# Patient Record
Sex: Female | Born: 1980 | Race: White | Hispanic: No | Marital: Single | Smoking: Never smoker
Health system: Southern US, Community
[De-identification: ages and names within clinical notes are randomized; demographics above are authoritative.]

## PROBLEM LIST (undated history)

## (undated) DIAGNOSIS — T7840XA Allergy, unspecified, initial encounter: Secondary | ICD-10-CM

## (undated) HISTORY — DX: Allergy, unspecified, initial encounter: T78.40XA

---

## 2004-08-27 ENCOUNTER — Emergency Department (HOSPITAL_COMMUNITY): Admission: EM | Admit: 2004-08-27 | Discharge: 2004-08-27 | Payer: Self-pay | Admitting: Emergency Medicine

## 2007-04-05 ENCOUNTER — Other Ambulatory Visit: Admission: RE | Admit: 2007-04-05 | Discharge: 2007-04-05 | Payer: Self-pay | Admitting: Family Medicine

## 2010-07-29 ENCOUNTER — Emergency Department (HOSPITAL_COMMUNITY): Admission: EM | Admit: 2010-07-29 | Discharge: 2010-07-29 | Payer: Self-pay | Admitting: Family Medicine

## 2011-12-10 ENCOUNTER — Ambulatory Visit (INDEPENDENT_AMBULATORY_CARE_PROVIDER_SITE_OTHER): Payer: 59 | Admitting: Internal Medicine

## 2011-12-10 ENCOUNTER — Ambulatory Visit: Payer: 59

## 2011-12-10 DIAGNOSIS — R05 Cough: Secondary | ICD-10-CM

## 2011-12-10 DIAGNOSIS — R042 Hemoptysis: Secondary | ICD-10-CM

## 2011-12-10 DIAGNOSIS — J209 Acute bronchitis, unspecified: Secondary | ICD-10-CM

## 2011-12-10 DIAGNOSIS — J029 Acute pharyngitis, unspecified: Secondary | ICD-10-CM

## 2011-12-10 DIAGNOSIS — R059 Cough, unspecified: Secondary | ICD-10-CM

## 2011-12-10 LAB — POCT CBC
Granulocyte percent: 62.9 %G (ref 37–80)
HCT, POC: 46.6 % (ref 37.7–47.9)
MID (cbc): 1 — AB (ref 0–0.9)
POC Granulocyte: 7.4 — AB (ref 2–6.9)
POC LYMPH PERCENT: 28.5 %L (ref 10–50)
Platelet Count, POC: 370 10*3/uL (ref 142–424)
RDW, POC: 13.6 %

## 2011-12-10 MED ORDER — AZITHROMYCIN 500 MG PO TABS: 500.0000 mg | ORAL_TABLET | Freq: Every day | ORAL | Status: AC

## 2011-12-10 MED ORDER — PREDNISONE 50 MG PO TABS: ORAL_TABLET | ORAL | Status: AC

## 2011-12-10 NOTE — Progress Notes (Signed)
  Subjective:    Patient ID: Joyce Adkins, female    DOB: [birth year], 31 y.o.   MRN: [id number]  HPIcough sorethroat fatigue. Coughing small amts of blood non smoker. Low grade fever. Has chills. No nvd. No ha no stiff neck.     Review of Systems  Constitutional: Positive for chills, activity change, appetite change and fatigue.  HENT: Positive for congestion and rhinorrhea.   Eyes: Negative.   Respiratory: Positive for cough.   Cardiovascular: Negative.   Gastrointestinal: Negative.   Genitourinary: Negative.   Musculoskeletal: Negative.   Skin: Negative.   Neurological: Negative.   Hematological: Negative.   Psychiatric/Behavioral: Negative.   All other systems reviewed and are negative.       Objective:   Physical Exam  Constitutional: She is oriented to person, place, and time. She appears well-developed and well-nourished.  HENT:  Head: Normocephalic.       Throat injected. Uvula edematous. No exudate. No abcess  Eyes: Conjunctivae and EOM are normal. Pupils are equal, round, and reactive to light.  Neck: Normal range of motion. Neck supple.  Cardiovascular: Normal rate, regular rhythm and normal heart sounds.   Pulmonary/Chest: Effort normal. She has wheezes.  Abdominal: Soft. Bowel sounds are normal.  Musculoskeletal: Normal range of motion.  Neurological: She is alert and oriented to person, place, and time.  Skin: Skin is warm and dry.  Psychiatric: She has a normal mood and affect. Her behavior is normal. Judgment and thought content normal.  [facility] reading (PRIMARY) by  Dr. Mindi Junker. Increased bronchial markings no pneumonia.  Results for orders placed in visit on 12/10/11  POCT CBC      Component Value Range   WBC 11.7 (*) 4.6 - 10.2 (K/uL)   Lymph, poc 3.3  0.6 - 3.4    POC LYMPH PERCENT 28.5  10 - 50 (%L)   MID (cbc) 1.0 (*) 0 - 0.9    POC MID % 8.6  0 - 12 (%M)   POC Granulocyte 7.4 (*) 2 - 6.9    Granulocyte percent 62.9  37 - 80 (%G)   RBC 5.16   4.04 - 5.48 (M/uL)   Hemoglobin 15.1  12.2 - 16.2 (g/dL)   HCT, POC 40.9  81.1 - 47.9 (%)   MCV 90.3  80 - 97 (fL)   MCH, POC 29.3  27 - 31.2 (pg)   MCHC 32.4  31.8 - 35.4 (g/dL)   RDW, POC 91.4     Platelet Count, POC 370  142 - 424 (K/uL)   MPV 9.3  0 - 99.8 (fL)   visi       Assessment & Plan:  Hemoptysis and edema of pharynx will do xray lab eval suspect will give antibitoics and steroids, antitussives.

## 2013-08-16 ENCOUNTER — Ambulatory Visit (INDEPENDENT_AMBULATORY_CARE_PROVIDER_SITE_OTHER): Payer: 59 | Admitting: Physician Assistant

## 2013-08-16 VITALS — BP 124/76 | HR 100 | Temp 97.6°F | Resp 16 | Ht 62.0 in | Wt 183.0 lb

## 2013-08-16 DIAGNOSIS — J029 Acute pharyngitis, unspecified: Secondary | ICD-10-CM

## 2013-08-16 DIAGNOSIS — R05 Cough: Secondary | ICD-10-CM

## 2013-08-16 DIAGNOSIS — J3489 Other specified disorders of nose and nasal sinuses: Secondary | ICD-10-CM

## 2013-08-16 DIAGNOSIS — R059 Cough, unspecified: Secondary | ICD-10-CM

## 2013-08-16 DIAGNOSIS — R0981 Nasal congestion: Secondary | ICD-10-CM

## 2013-08-16 LAB — POCT CBC
Granulocyte percent: 74.9 %G (ref 37–80)
HCT, POC: 45.5 % (ref 37.7–47.9)
Hemoglobin: 14.3 g/dL (ref 12.2–16.2)
Lymph, poc: 2.7 (ref 0.6–3.4)
MCH, POC: 28.5 pg (ref 27–31.2)
MCHC: 31.4 g/dL — AB (ref 31.8–35.4)
MCV: 90.9 fL (ref 80–97)
MID (cbc): 0.7 (ref 0–0.9)
MPV: 8.4 fL (ref 0–99.8)
POC Granulocyte: 10.2 — AB (ref 2–6.9)
POC LYMPH PERCENT: 19.9 %L (ref 10–50)
POC MID %: 5.2 %M (ref 0–12)
Platelet Count, POC: 439 10*3/uL — AB (ref 142–424)
RBC: 5.01 M/uL (ref 4.04–5.48)
RDW, POC: 14.8 %
WBC: 13.6 10*3/uL — AB (ref 4.6–10.2)

## 2013-08-16 LAB — POCT RAPID STREP A (OFFICE): Rapid Strep A Screen: NEGATIVE

## 2013-08-16 MED ORDER — AMOXICILLIN 875 MG PO TABS: 875.0000 mg | ORAL_TABLET | Freq: Two times a day (BID) | ORAL | Status: DC

## 2013-08-16 MED ORDER — HYDROCODONE-HOMATROPINE 5-1.5 MG/5ML PO SYRP: 5.0000 mL | ORAL_SOLUTION | Freq: Three times a day (TID) | ORAL | Status: DC | PRN

## 2013-08-16 MED ORDER — IPRATROPIUM BROMIDE 0.03 % NA SOLN: 2.0000 | Freq: Two times a day (BID) | NASAL | Status: DC

## 2013-08-16 NOTE — Progress Notes (Signed)
Subjective:    Patient ID: Joyce Adkins, female    DOB: 05-Jun-1981, 32 y.o.   MRN: [id number]  Sore Throat  Associated symptoms include congestion and coughing. Pertinent negatives include no ear pain, headaches, shortness of breath or trouble swallowing.  Cough Associated symptoms include a sore throat and wheezing (subjective). Pertinent negatives include no chest pain, chills, ear pain, fever, headaches, postnasal drip or shortness of breath.   32 year old female presents for evaluation of 4 day history of sore throat, nasal congestion and cough.  States symptoms have been progressively worsening and last night she noticed "white spots" on her tonsils. Denies headache, otalgia, sinus pain, hemoptysis, fever, chills, nausea, or vomiting.  She has no hx of frequent strep infections and no known strep contacts.  Works at [employer] sites.   Patient is doing well with no other concerns today.     Review of Systems  Constitutional: Negative for fever and chills.  HENT: Positive for congestion and sore throat. Negative for ear pain, postnasal drip, sinus pressure and trouble swallowing.   Respiratory: Positive for cough and wheezing (subjective). Negative for shortness of breath.   Cardiovascular: Negative for chest pain.  Neurological: Negative for dizziness and headaches.       Objective:   Physical Exam  Constitutional: She is oriented to person, place, and time. She appears well-developed and well-nourished.  HENT:  Head: Normocephalic and atraumatic.  Right Ear: Hearing, tympanic membrane, external ear and ear canal normal.  Left Ear: Hearing, tympanic membrane, external ear and ear canal normal.  Mouth/Throat: Uvula is midline and mucous membranes are normal. Oropharyngeal exudate and posterior oropharyngeal erythema (2+ tonsillar swelling) present. No posterior oropharyngeal edema or tonsillar abscesses.  Eyes: Conjunctivae are normal.  Neck:  Normal range of motion. Neck supple.  Cardiovascular: Normal rate, regular rhythm and normal heart sounds.   Pulmonary/Chest: Effort normal and breath sounds normal.  Lymphadenopathy:    She has no cervical adenopathy.  Neurological: She is alert and oriented to person, place, and time.  Psychiatric: She has a normal mood and affect. Her behavior is normal. Judgment and thought content normal.     Results for orders placed in visit on 08/16/13  POCT RAPID STREP A (OFFICE)      Result Value Range   Rapid Strep A Screen Negative  Negative  POCT CBC      Result Value Range   WBC 13.6 (*) 4.6 - 10.2 K/uL   Lymph, poc 2.7  0.6 - 3.4   POC LYMPH PERCENT 19.9  10 - 50 %L   MID (cbc) 0.7  0 - 0.9   POC MID % 5.2  0 - 12 %M   POC Granulocyte 10.2 (*) 2 - 6.9   Granulocyte percent 74.9  37 - 80 %G   RBC 5.01  4.04 - 5.48 M/uL   Hemoglobin 14.3  12.2 - 16.2 g/dL   HCT, POC 08.6  57.8 - 47.9 %   MCV 90.9  80 - 97 fL   MCH, POC 28.5  27 - 31.2 pg   MCHC 31.4 (*) 31.8 - 35.4 g/dL   RDW, POC 46.9     Platelet Count, POC 439 (*) 142 - 424 K/uL   MPV 8.4  0 - 99.8 fL        Assessment & Plan:   Acute pharyngitis - Plan: POCT rapid strep A, POCT CBC, Culture, Group A Strep, amoxicillin (AMOXIL) 875 MG tablet  Nasal congestion - Plan: POCT CBC, Culture, Group A Strep, ipratropium (ATROVENT) 0.03 % nasal spray  Cough - Plan: POCT CBC, Culture, Group A Strep, HYDROcodone-homatropine (HYCODAN) 5-1.5 MG/5ML syrup  Start amoxicillin 875 mg bid x 10 days Throat culture sent Atrovent NS twice daily to help with congestion Hycodan qhs prn cough - caution sedation Increase fluids and rest Follow up if symptoms worsen or fail to improve.

## 2013-08-18 LAB — CULTURE, GROUP A STREP: Organism ID, Bacteria: NORMAL

## 2013-09-30 ENCOUNTER — Ambulatory Visit (INDEPENDENT_AMBULATORY_CARE_PROVIDER_SITE_OTHER): Payer: 59 | Admitting: Family Medicine

## 2013-09-30 VITALS — BP 130/90 | HR 79 | Temp 98.1°F | Resp 18 | Ht 62.0 in | Wt 180.0 lb

## 2013-09-30 DIAGNOSIS — Z23 Encounter for immunization: Secondary | ICD-10-CM

## 2013-09-30 DIAGNOSIS — Z0000 Encounter for general adult medical examination without abnormal findings: Secondary | ICD-10-CM

## 2013-09-30 DIAGNOSIS — E663 Overweight: Secondary | ICD-10-CM | POA: Insufficient documentation

## 2013-09-30 LAB — POCT CBC
Granulocyte percent: 59.3 %G (ref 37–80)
MID (cbc): 0.7 (ref 0–0.9)
MPV: 9.2 fL (ref 0–99.8)
POC Granulocyte: 5 (ref 2–6.9)
POC MID %: 8.2 %M (ref 0–12)
Platelet Count, POC: 406 10*3/uL (ref 142–424)
RBC: 5.22 M/uL (ref 4.04–5.48)

## 2013-09-30 LAB — COMPREHENSIVE METABOLIC PANEL
ALT: <8 U/L (ref 0–35)
AST: 22 U/L (ref 0–37)
Albumin: 4.5 g/dL (ref 3.5–5.2)
CO2: 25 mEq/L (ref 19–32)
Calcium: 9.6 mg/dL (ref 8.4–10.5)
Chloride: 104 mEq/L (ref 96–112)
Creat: 0.7 mg/dL (ref 0.50–1.10)
Potassium: 4.3 mEq/L (ref 3.5–5.3)
Sodium: 140 mEq/L (ref 135–145)
Total Protein: 7.4 g/dL (ref 6.0–8.3)

## 2013-09-30 LAB — LIPID PANEL
HDL: 50 mg/dL (ref >39)
LDL Cholesterol: 87 mg/dL (ref 0–99)
VLDL: 16 mg/dL (ref 0–40)

## 2013-09-30 LAB — TSH: TSH: 1.64 u[IU]/mL (ref 0.350–4.500)

## 2013-09-30 NOTE — Progress Notes (Signed)
[facility] [address] [place] [address] [phone]  Date:  09/30/2013   Name:  Joyce Adkins   DOB:  April 12, 1981   MRN:  [id number]  PCP:  No primary provider on file.    Chief Complaint: Employment Physical   History of Present Illness:  Joyce Adkins is a 32 y.o. very pleasant female patient who presents with the following:  She is here today for a CPE for her job; she has a form to complete.  She needs basic labs, does not desire a pap today.  She is fasting today. 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   LMP 12/18 Admits she is not sure of the date of her last tetanus shot but thinks it has been a long time- maybe before she started college. Would like to get a flu shot today as well She is single, does not smoke, social alcohol only, no drugs, not currently SA  There are no active problems to display for this patient.   Past Medical History  Diagnosis Date  . Allergy     History reviewed. No pertinent past surgical history.  History  Substance Use Topics  . Smoking status: Never Smoker   . Smokeless tobacco: Not on file  . Alcohol Use: Not on file    Family History  Problem Relation Age of Onset  . Heart disease Mother   . Diabetes Father   . Cancer Maternal Grandmother   . Heart disease Maternal Grandmother   . Stroke Paternal Grandfather   . Parkinson's disease Paternal Grandfather     No Known Allergies  Medication list has been reviewed and updated.  Current Outpatient Prescriptions on File Prior to Visit  Medication Sig Dispense Refill  . buPROPion (WELLBUTRIN XL) 300 MG 24 hr tablet Take 300 mg by mouth daily.      . clonazePAM (KLONOPIN) 0.5 MG tablet Take 0.5 mg by mouth 2 (two) times daily as needed.      Marland Kitchen ipratropium (ATROVENT) 0.03 % nasal spray Place 2 sprays into the nose 2 (two) times daily.  30 mL  0  . sertraline (ZOLOFT) 50 MG tablet Take 50  mg by mouth daily.      Marland Kitchen zolpidem (AMBIEN) 10 MG tablet Take 10 mg by mouth at bedtime as needed.       No current facility-administered medications on file prior to visit.    Review of Systems:  As per HPI- otherwise negative.   Physical Examination: Filed Vitals:   09/30/13 0829  BP: 130/90  Pulse: 79  Temp: 98.1 F (36.7 C)  Resp: 18   Filed Vitals:   09/30/13 0829  Height: 5\' 2"  (1.575 m)  Weight: 180 lb (81.647 kg)   Body mass index is 32.91 kg/(m^2). Ideal Body Weight: Weight in (lb) to have BMI = 25: 136.4  GEN: WDWN, NAD, Non-toxic, A & O x 3, overweight HEENT: Atraumatic, Normocephalic. Neck supple. No masses, No LAD.  Bilateral TM wnl, oropharynx normal.  PEERL,EOMI.   Ears and Nose: No external deformity. CV: RRR, No M/G/R. No JVD. No thrill. No extra heart sounds. PULM: CTA B, no wheezes, crackles, rhonchi. No retractions. No resp. distress. No accessory muscle use. ABD: S, NT, ND. No rebound. No HSM. EXTR: No c/c/e NEURO Normal gait.  PSYCH: Normally interactive. Conversant. Not depressed or anxious appearing.  Calm demeanor.    Assessment and Plan:  Physical exam - Plan: POCT CBC, Comprehensive metabolic panel, TSH, Lipid panel, Flu Vaccine QUAD 36+ mos IM, Tdap vaccine greater than or equal to 7yo IM  Flu shot and tdap today. Will plan further follow- up pending labs. See patient instructions for more details.      Signed Abbe Amsterdam, MD

## 2013-09-30 NOTE — Patient Instructions (Signed)
Good to see you today!  You received a Tdap vaccine today- in about 10 years you will be due for a plain "Td" vaccine.   I will be in touch with your labs when they come in; if you like, sign up for mychart.  This makes communication faster and easier.    Happy holidays!

## 2013-10-03 ENCOUNTER — Encounter: Payer: Self-pay | Admitting: Family Medicine

## 2013-10-13 ENCOUNTER — Ambulatory Visit (INDEPENDENT_AMBULATORY_CARE_PROVIDER_SITE_OTHER): Payer: 59 | Admitting: Family Medicine

## 2013-10-13 ENCOUNTER — Ambulatory Visit: Payer: 59

## 2013-10-13 VITALS — BP 142/106 | HR 86 | Temp 99.0°F | Resp 16 | Ht 62.0 in | Wt 187.0 lb

## 2013-10-13 DIAGNOSIS — R579 Shock, unspecified: Secondary | ICD-10-CM

## 2013-10-13 DIAGNOSIS — M25559 Pain in unspecified hip: Secondary | ICD-10-CM

## 2013-10-13 DIAGNOSIS — R079 Chest pain, unspecified: Secondary | ICD-10-CM

## 2013-10-13 DIAGNOSIS — T148XXA Other injury of unspecified body region, initial encounter: Secondary | ICD-10-CM

## 2013-10-13 DIAGNOSIS — M25551 Pain in right hip: Secondary | ICD-10-CM

## 2013-10-13 DIAGNOSIS — S20219A Contusion of unspecified front wall of thorax, initial encounter: Secondary | ICD-10-CM

## 2013-10-13 MED ORDER — TRAMADOL HCL 50 MG PO TABS: 50.0000 mg | ORAL_TABLET | Freq: Three times a day (TID) | ORAL | Status: DC | PRN

## 2013-10-13 MED ORDER — CYCLOBENZAPRINE HCL 5 MG PO TABS: 5.0000 mg | ORAL_TABLET | Freq: Every evening | ORAL | Status: DC | PRN
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
## 2013-11-03 ENCOUNTER — Encounter: Payer: Self-pay | Admitting: Physician Assistant

## 2013-11-03 ENCOUNTER — Telehealth: Payer: Self-pay

## 2013-11-03 ENCOUNTER — Ambulatory Visit (INDEPENDENT_AMBULATORY_CARE_PROVIDER_SITE_OTHER): Payer: 59 | Admitting: Physician Assistant

## 2013-11-03 VITALS — BP 118/68 | HR 73 | Temp 98.1°F | Resp 16 | Ht 62.25 in | Wt 190.0 lb

## 2013-11-03 DIAGNOSIS — W5503XA Scratched by cat, initial encounter: Principal | ICD-10-CM

## 2013-11-03 DIAGNOSIS — S60519A Abrasion of unspecified hand, initial encounter: Secondary | ICD-10-CM

## 2013-11-03 DIAGNOSIS — IMO0002 Reserved for concepts with insufficient information to code with codable children: Secondary | ICD-10-CM

## 2013-11-03 MED ORDER — AZITHROMYCIN 250 MG PO TABS: ORAL_TABLET | ORAL | Status: DC

## 2013-11-03 NOTE — Patient Instructions (Signed)
Take the antibiotics as directed.  Recheck in one week, sooner if anything is worsening   Cat Scratch Disease Cats often injure people by scratching or biting. This site of injury can become infected with a particular germ or bacteria present in the mouth of or on the cat. This germ is called Bartonella henselae. This infection is identified by the common name cat scratch disease (CSD).  SYMPTOMS  A red and sore pimple or bump, with or without pus, on the skin where the cat scratched or bit. The pimple or sore may be present for as long as three weeks after the scratch or bite occurred.  One or more enlarged lymph glands located toward the center of the body from where the injury occurred.  Less common symptoms include low-grade fever, tiredness, fatigue, headache and/or sore throat. DIAGNOSIS  The diagnosis is typically made by your caregiver who notes the history of a scratch or bite from a cat, and finds the skin sore and swollen lymph glands in the described area.  Culture of any drainage or pus from the injury site, or a needle aspiration or piece of tissue (biopsy) from a swollen lymph gland may also be done to confirm the diagnosis and assure that a different infection or disease is not causing your illness. Rare but serious complications may occur, they include:  Parinaud's syndrome - fever, swollen lymph glands and inflammation of the eye (conjunctivitis).  Infection of the brain (encephalitis).  Infection of the nerve of the eye (neuroretinitis).  Infection of the bone (osteomyelitis). TREATMENT  Usually treatment is not necessary or helpful, especially if you have a normal immune system. When infection is very severe, it may be treated with a medicine that kills the bacteria (antibiotic).  People with immune system problems (such as having AIDS or an organ transplant, or being on steroids or other immune modifying drugs) should be treated with antibiotics. HOME CARE  INSTRUCTIONS   Avoid injury while playing with cats.  Wash well after playing with cats.  Do not let your cat lick sores on your body.  Do not let your cat roam around outside of your house.  Keep the area of the cat scratch clean. Wash it with soap and water or apply an antiseptic solution such as povidone iodine.  You should get a tetanus shot if you have not had one in the past 5 or 10 years. If you receive one, your arm may get swollen and red and warm to the touch at the shot site. This is a common response to the medication in the shot. If you did not receive a tetanus shot here because you did not recall when your last one was given, make sure to check with your caregiver's office and determine if one is needed. Generally, for a "dirty" wound, you should receive a tetanus booster if you have not had one in the last five years. If you have a "clean" wound, you should receive a tetanus booster if you have not had one in the last ten years. SEEK IMMEDIATE MEDICAL CARE IF:   You have worsening signs of infection, such as more redness, increased pain, red streaking or pus coming from the wound, or warmth or swelling around the area of the scratch.  You develop worsening swollen lymph glands.  You develop abdominal pain, have problems with your vision or develop a skin rash.  You have a fever.  You become more tired or dizzy, or have a worsening headache.  You develop inflammation of your eye or have increasing vision problems.  You have pain in one of your bones.  You develop a stiff neck.  You pass out. MAKE SURE YOU:   Understand these instructions.  Will watch your condition.  Will get help right away if you are not doing well or get worse. Document Released: 09/22/2000 Document Revised: 12/18/2011 Document Reviewed: 11/04/2008 [facility] Patient Information 2014 [place], [place].

## 2013-11-03 NOTE — Progress Notes (Signed)
   Subjective:    Patient ID: Waldo LaineDonna S Cui, female    DOB: June 12, 1981, 33 y.o.   MRN: [id number]  HPI   Ms. Allayne GitelmanRasmussen is a very pleasant 33 yr old female here with concern for infection after sustaining a cat scratch 6 days ago.  Was scratched by her friend's cat on her left wrist.  The cat is an indoor cat and is utd on vaccinations.  About 2-3 days after sustaining the scratch, the pt reports development of red bumps along both scratches.  She denies pain or itching, but states the area is irritated.  She applied an antibacterial ointment after the bumps developed but saw no improvement so discontinued.  She denies any associated symptoms including fever, chills, adenopathy.  Last Tdap 09/30/13  Review of Systems  Constitutional: Negative for fever and chills.  Respiratory: Negative.   Cardiovascular: Negative.   Gastrointestinal: Negative.   Musculoskeletal: Negative for arthralgias and joint swelling.  Skin: Positive for color change and rash. Negative for wound.  Hematological: Negative for adenopathy.       Objective:   Physical Exam  Vitals reviewed. Constitutional: She is oriented to person, place, and time. She appears well-developed and well-nourished. No distress.  HENT:  Head: Normocephalic and atraumatic.  Eyes: Conjunctivae are normal. No scleral icterus.  Cardiovascular: Normal rate, regular rhythm and normal heart sounds.   Pulmonary/Chest: Effort normal and breath sounds normal. She has no wheezes. She has no rales.  Musculoskeletal:       Arms: Two linear scratches at left wrist; fine erythematous vesicles along both scratches; no evidence of cellulitis - no warmth, induration; no drainage; tenderness; no lymphangitis; no epitrochlear or axillary nodes; full ROM  Lymphadenopathy:       Left axillary: No pectoral and no lateral adenopathy present.      Left: No supraclavicular and no epitrochlear adenopathy present.  Neurological: She is alert and oriented to  person, place, and time.  Skin: Skin is warm and dry.  Psychiatric: She has a normal mood and affect. Her behavior is normal.       Assessment & Plan:  Cat scratch of hand - Plan: azithromycin (ZITHROMAX) 250 MG tablet  Ms. Allayne GitelmanRasmussen is a very pleasant 33 yr old female here with the development of fine erythematous vesicular lesions along 2 cat scratches that were sustained a week ago.  There is no evidence of a cellulitis.  She has no lymphangitis, adenopathy, or fever, but question early cat scratch disease.  Will treat for bartonella with azithromycin.  Follow up in 1 wk, sooner if worsening or new symptoms  E. Frances FurbishElizabeth Yexalen Deike MHS, PA-C [facility] 1/26/201510:00 PM

## 2014-07-24 ENCOUNTER — Other Ambulatory Visit: Payer: Self-pay

## 2014-09-09 ENCOUNTER — Encounter: Payer: Self-pay | Admitting: Family Medicine

## 2014-09-09 ENCOUNTER — Ambulatory Visit (INDEPENDENT_AMBULATORY_CARE_PROVIDER_SITE_OTHER): Payer: 59 | Admitting: Family Medicine

## 2014-09-09 VITALS — BP 174/108 | HR 110 | Temp 98.3°F | Resp 16 | Ht 62.0 in | Wt 189.2 lb

## 2014-09-09 DIAGNOSIS — N939 Abnormal uterine and vaginal bleeding, unspecified: Secondary | ICD-10-CM

## 2014-09-09 DIAGNOSIS — Z124 Encounter for screening for malignant neoplasm of cervix: Secondary | ICD-10-CM

## 2014-09-09 DIAGNOSIS — Z3009 Encounter for other general counseling and advice on contraception: Secondary | ICD-10-CM

## 2014-09-09 DIAGNOSIS — R030 Elevated blood-pressure reading, without diagnosis of hypertension: Secondary | ICD-10-CM

## 2014-09-09 DIAGNOSIS — IMO0001 Reserved for inherently not codable concepts without codable children: Secondary | ICD-10-CM

## 2014-09-09 DIAGNOSIS — Z23 Encounter for immunization: Secondary | ICD-10-CM

## 2014-09-09 DIAGNOSIS — Z0000 Encounter for general adult medical examination without abnormal findings: Secondary | ICD-10-CM

## 2014-09-09 LAB — COMPREHENSIVE METABOLIC PANEL
ALBUMIN: 4.5 g/dL (ref 3.5–5.2)
ALT: 10 U/L (ref 0–35)
AST: 21 U/L (ref 0–37)
Alkaline Phosphatase: 105 U/L (ref 39–117)
BUN: 12 mg/dL (ref 6–23)
CALCIUM: 9.3 mg/dL (ref 8.4–10.5)
CHLORIDE: 106 meq/L (ref 96–112)
CO2: 24 mEq/L (ref 19–32)
Creat: 0.63 mg/dL (ref 0.50–1.10)
Glucose, Bld: 89 mg/dL (ref 70–99)
POTASSIUM: 4.1 meq/L (ref 3.5–5.3)
Sodium: 139 mEq/L (ref 135–145)
Total Bilirubin: 0.4 mg/dL (ref 0.2–1.2)
Total Protein: 7.5 g/dL (ref 6.0–8.3)

## 2014-09-09 LAB — POCT URINALYSIS DIPSTICK
Glucose, UA: NEGATIVE
KETONES UA: 15
LEUKOCYTES UA: NEGATIVE
NITRITE UA: NEGATIVE
Spec Grav, UA: 1.02
Urobilinogen, UA: 0.2
pH, UA: 5

## 2014-09-09 LAB — TSH: TSH: 1.356 u[IU]/mL (ref 0.350–4.500)

## 2014-09-09 NOTE — Progress Notes (Signed)
Subjective:    Patient ID: Waldo LaineDonna S Lockridge, female    DOB: Jan 20, 1981, 33 y.o.   MRN: [id number]  HPI Patient presents today for PAP smear and contraception counseling. She had a physical 1/15.  She has painful periods with heavy bleeding and painful cramps. Takes ibuprofen 800 mg every 4 hours with some relief. Has been on OCPs in the past and had bad side effects with Seasonal. She is interested in an IUD. She is not sexually active and does not plan to have children.   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 She is currently living alone but will be moving into a house with some friends. She will be within walking distance to campus and hopes to be more active. She also hopes to eat more home cooked food. She has had some increase in her weight that she attributes to stress and eating out a lot.   She has had some intermittently elevated BPs in the past. She was on HCTZ and had leg cramps. She lost weight and her blood pressure came down and she stopped her HCTZ. The following are her readings prior to donating blood: 02/17/13- 112/82 07/10/13- 118/70 01/03/14- 122/80 05/16/14- 140/90   Review of Systems  Constitutional: Negative for fever and chills.  Respiratory: Negative for chest tightness and shortness of breath.   Cardiovascular: Negative for chest pain.  Gastrointestinal: Negative for abdominal pain, diarrhea and constipation.  Genitourinary: Positive for menstrual problem. Negative for dysuria, frequency, hematuria, vaginal discharge and vaginal pain.  Neurological: Positive for headaches.      Objective:   Physical Exam  Constitutional: She is oriented to person, place, and time. She appears well-developed and well-nourished.  HENT:  Head: Normocephalic and atraumatic.  Right Ear: External ear normal.  Left Ear: External ear normal.  Nose: Nose normal.  Mouth/Throat: Oropharynx is clear and moist.  Eyes:  Conjunctivae are normal.  Neck: Normal range of motion. Neck supple.  Cardiovascular: Normal rate, regular rhythm and normal heart sounds.   Pulmonary/Chest: Effort normal and breath sounds normal.  Abdominal: Soft. Bowel sounds are normal. She exhibits no distension and no mass. There is no tenderness. There is no rebound and no guarding.  Genitourinary: Vagina normal. Pelvic exam was performed with patient supine. There is no rash, tenderness, lesion or injury on the right labia. There is no rash, tenderness, lesion or injury on the left labia. Cervix exhibits no discharge and no friability. No vaginal discharge found.  Musculoskeletal: Normal range of motion.  Neurological: She is alert and oriented to person, place, and time. She has normal reflexes.  Skin: Skin is warm and dry.  Psychiatric: She has a normal mood and affect. Her behavior is normal. Judgment normal.  Vitals reviewed.  BP 174/108 mmHg  Pulse 110  Temp(Src) 98.3 F (36.8 C) (Oral)  Resp 16  Ht 5\' 2"  (1.575 m)  Wt 189 lb 3.2 oz (85.821 kg)  BMI 34.60 kg/m2  SpO2 96%  LMP 09/03/2014  Recheck bp- 154-100     Assessment & Plan:  1. Encounter for other general counseling or advice on contraception - Ambulatory referral to Gynecology to discuss IUD  2. Abnormal uterine bleeding - Discussed taking ibuprofen for week premenstrually to decrease bleeding and cramping. Can also try B complex vitamin.  - CBC - TSH  3. Elevated BP - Comprehensive metabolic panel - Will see how labs look. Patient giving blood in 2 days,  will call her regarding labs and see what her BP was and decide whether or not to start anti-hypertensive.  - Encouraged weight loss and increased activity, decreased sodium intake  4. Flu vaccine need - Flu Vaccine QUAD 36+ mos IM  5. Annual physical exam - POCT urinalysis dipstick  6. Cervical cancer screening - Pap IG, CT/NG w/ reflex HPV when ASC-U   Emi Belfasteborah B. Gessner, FNP-BC  [facility]  09/11/2014 10:56 PM

## 2014-09-09 NOTE — Patient Instructions (Signed)

## 2014-09-10 LAB — PAP IG, CT-NG, RFX HPV ASCU
Chlamydia Probe Amp: NEGATIVE
GC Probe Amp: NEGATIVE

## 2014-09-10 LAB — CBC
HEMATOCRIT: 42 % (ref 36.0–46.0)
Hemoglobin: 13.6 g/dL (ref 12.0–15.0)
MCH: 25.4 pg — ABNORMAL LOW (ref 26.0–34.0)
MCHC: 32.4 g/dL (ref 30.0–36.0)
MCV: 78.4 fL (ref 78.0–100.0)
MPV: 9.9 fL (ref 9.4–12.4)
Platelets: 481 10*3/uL — ABNORMAL HIGH (ref 150–400)
RBC: 5.36 MIL/uL — AB (ref 3.87–5.11)
RDW: 15.9 % — ABNORMAL HIGH (ref 11.5–15.5)
WBC: 10.1 10*3/uL (ref 4.0–10.5)

## 2014-09-12 ENCOUNTER — Encounter: Payer: Self-pay | Admitting: Family Medicine

## 2014-09-14 ENCOUNTER — Other Ambulatory Visit: Payer: Self-pay | Admitting: Family Medicine

## 2014-09-14 DIAGNOSIS — I10 Essential (primary) hypertension: Secondary | ICD-10-CM

## 2014-09-14 MED ORDER — LISINOPRIL 10 MG PO TABS: 10.0000 mg | ORAL_TABLET | Freq: Every day | ORAL | Status: DC

## 2014-12-18 ENCOUNTER — Ambulatory Visit (INDEPENDENT_AMBULATORY_CARE_PROVIDER_SITE_OTHER): Payer: 59 | Admitting: Emergency Medicine

## 2014-12-18 VITALS — BP 126/88 | HR 72 | Temp 98.3°F | Ht 62.6 in | Wt 201.8 lb

## 2014-12-18 DIAGNOSIS — J209 Acute bronchitis, unspecified: Secondary | ICD-10-CM | POA: Diagnosis not present

## 2014-12-18 DIAGNOSIS — J029 Acute pharyngitis, unspecified: Secondary | ICD-10-CM

## 2014-12-18 MED ORDER — HYDROCOD POLST-CHLORPHEN POLST 10-8 MG/5ML PO LQCR: 5.0000 mL | Freq: Two times a day (BID) | ORAL | Status: DC | PRN

## 2014-12-18 MED ORDER — AMOXICILLIN-POT CLAVULANATE 875-125 MG PO TABS: 1.0000 | ORAL_TABLET | Freq: Two times a day (BID) | ORAL | Status: DC

## 2014-12-18 NOTE — Progress Notes (Signed)
[facility] [address], [place] [id number] [phone]  Date:  12/18/2014   Name:  Joyce Adkins   DOB:  01-09-1981   MRN:  [id number]  PCP:  No primary care provider on file.    Chief Complaint: Sore Throat   History of Present Illness:  Joyce Adkins is a 34 y.o. very pleasant female patient who presents with the following:  Patient has a several day history of malaise and sore throat No fever or chills Occasional productive cough with purulent sputum.  no coryza No rash No nausea or vomiting. No stool change No wheezing or shortness of breath No improvement with over the counter medications or other home remedies. Denies other complaint or health concern today.   Patient Active Problem List   Diagnosis Date Noted  . Overweight 09/30/2013    Past Medical History  Diagnosis Date  . Allergy     No past surgical history on file.  History  Substance Use Topics  . Smoking status: Never Smoker   . Smokeless tobacco: Not on file  . Alcohol Use: Not on file    Family History  Problem Relation Age of Onset  . Heart disease Mother   . Diabetes Father   . Cancer Maternal Grandmother   . Heart disease Maternal Grandmother   . Stroke Paternal Grandfather   . Parkinson's disease Paternal Grandfather     No Known Allergies  Medication list has been reviewed and updated.  Current Outpatient Prescriptions on File Prior to Visit  Medication Sig Dispense Refill  . lisinopril (PRINIVIL,ZESTRIL) 10 MG tablet Take 1 tablet (10 mg total) by mouth daily. (Patient not taking: Reported on 12/18/2014) 30 tablet 5   No current facility-administered medications on file prior to visit.    Review of Systems:  As per HPI, otherwise negative.    Physical Examination: Filed Vitals:   12/18/14 1538  BP: 126/88  Pulse: 72  Temp: 98.3 F (36.8 C)   Filed Vitals:   12/18/14 1538  Height: 5' 2.6" (1.59 m)  Weight: 201 lb 12.8 oz (91.536  kg)   Body mass index is 36.21 kg/(m^2). Ideal Body Weight: Weight in (lb) to have BMI = 25: 139  GEN: WDWN, NAD, Non-toxic, A & O x 3 HEENT: Atraumatic, Normocephalic. Neck supple. No masses, No LAD. Ears and Nose: No external deformity. CV: RRR, No M/G/R. No JVD. No thrill. No extra heart sounds. PULM: CTA B, no wheezes, crackles, rhonchi. No retractions. No resp. distress. No accessory muscle use. ABD: S, NT, ND, +BS. No rebound. No HSM. EXTR: No c/c/e NEURO Normal gait.  PSYCH: Normally interactive. Conversant. Not depressed or anxious appearing.  Calm demeanor.    Assessment and Plan: Bronchitis Pharyngitis augmentin tussionex  Signed,  Phillips OdorJeffery Anderson, MD

## 2014-12-18 NOTE — Patient Instructions (Signed)

## 2015-10-09 ENCOUNTER — Ambulatory Visit (INDEPENDENT_AMBULATORY_CARE_PROVIDER_SITE_OTHER): Payer: 59 | Admitting: Family Medicine

## 2015-10-09 VITALS — BP 122/80 | HR 73 | Temp 98.0°F | Resp 16 | Ht 62.0 in | Wt 206.0 lb

## 2015-10-09 DIAGNOSIS — R351 Nocturia: Secondary | ICD-10-CM

## 2015-10-09 DIAGNOSIS — R350 Frequency of micturition: Secondary | ICD-10-CM | POA: Diagnosis not present

## 2015-10-09 DIAGNOSIS — F411 Generalized anxiety disorder: Secondary | ICD-10-CM | POA: Diagnosis not present

## 2015-10-09 DIAGNOSIS — Z0000 Encounter for general adult medical examination without abnormal findings: Secondary | ICD-10-CM | POA: Diagnosis not present

## 2015-10-09 DIAGNOSIS — E669 Obesity, unspecified: Secondary | ICD-10-CM

## 2015-10-09 DIAGNOSIS — F329 Major depressive disorder, single episode, unspecified: Secondary | ICD-10-CM | POA: Diagnosis not present

## 2015-10-09 DIAGNOSIS — G479 Sleep disorder, unspecified: Secondary | ICD-10-CM

## 2015-10-09 DIAGNOSIS — F32A Depression, unspecified: Secondary | ICD-10-CM

## 2015-10-09 LAB — POCT CBC
Granulocyte percent: 65 %G (ref 37–80)
HCT, POC: 39.6 % (ref 37.7–47.9)
HEMOGLOBIN: 13.4 g/dL (ref 12.2–16.2)
Lymph, poc: 3.8 — AB (ref 0.6–3.4)
MCH, POC: 26.8 pg — AB (ref 27–31.2)
MCHC: 33.9 g/dL (ref 31.8–35.4)
MCV: 79.1 fL — AB (ref 80–97)
MID (cbc): 0.4 (ref 0–0.9)
MPV: 7.6 fL (ref 0–99.8)
PLATELET COUNT, POC: 407 10*3/uL (ref 142–424)
POC Granulocyte: 7.7 — AB (ref 2–6.9)
POC LYMPH PERCENT: 32 %L (ref 10–50)
POC MID %: 3 %M (ref 0–12)
RBC: 5 M/uL (ref 4.04–5.48)
RDW, POC: 16.4 %
WBC: 11.8 10*3/uL — AB (ref 4.6–10.2)

## 2015-10-09 LAB — TSH: TSH: 2.445 u[IU]/mL (ref 0.350–4.500)

## 2015-10-09 LAB — COMPREHENSIVE METABOLIC PANEL
ALT: 6 U/L (ref 6–29)
AST: 15 U/L (ref 10–30)
Albumin: 4 g/dL (ref 3.6–5.1)
Alkaline Phosphatase: 88 U/L (ref 33–115)
BILIRUBIN TOTAL: 0.3 mg/dL (ref 0.2–1.2)
BUN: 9 mg/dL (ref 7–25)
CHLORIDE: 105 mmol/L (ref 98–110)
CO2: 29 mmol/L (ref 20–31)
CREATININE: 0.61 mg/dL (ref 0.50–1.10)
Calcium: 9.1 mg/dL (ref 8.6–10.2)
Glucose, Bld: 89 mg/dL (ref 65–99)
Potassium: 4.9 mmol/L (ref 3.5–5.3)
SODIUM: 141 mmol/L (ref 135–146)
TOTAL PROTEIN: 6.9 g/dL (ref 6.1–8.1)

## 2015-10-09 LAB — POCT URINALYSIS DIP (MANUAL ENTRY)
Bilirubin, UA: NEGATIVE
Glucose, UA: NEGATIVE
Ketones, POC UA: NEGATIVE
Leukocytes, UA: NEGATIVE
NITRITE UA: NEGATIVE
PH UA: 5.5
Protein Ur, POC: NEGATIVE
Spec Grav, UA: >=1.03
Urobilinogen, UA: 0.2

## 2015-10-09 LAB — POC MICROSCOPIC URINALYSIS (UMFC): MUCUS RE: ABSENT

## 2015-10-09 LAB — LIPID PANEL
CHOLESTEROL: 151 mg/dL (ref 125–200)
HDL: 48 mg/dL (ref >=46)
LDL Cholesterol: 77 mg/dL (ref <130)
Total CHOL/HDL Ratio: 3.1 Ratio (ref <=5.0)
Triglycerides: 128 mg/dL (ref <150)
VLDL: 26 mg/dL (ref <30)

## 2015-10-09 MED ORDER — ZOLPIDEM TARTRATE 5 MG PO TABS: 5.0000 mg | ORAL_TABLET | Freq: Every evening | ORAL | Status: DC | PRN

## 2015-10-09 MED ORDER — ZOLPIDEM TARTRATE 5 MG PO TABS: 5.0000 mg | ORAL_TABLET | Freq: Every evening | ORAL | Status: AC | PRN

## 2015-10-09 MED ORDER — OXYBUTYNIN CHLORIDE ER 5 MG PO TB24: 5.0000 mg | ORAL_TABLET | Freq: Every day | ORAL | Status: AC

## 2015-10-09 MED ORDER — SERTRALINE HCL 50 MG PO TABS: 50.0000 mg | ORAL_TABLET | Freq: Every day | ORAL | Status: AC

## 2015-10-09 NOTE — Patient Instructions (Signed)
Take the Ditropan (oxybutynin) 5 mg extended release 1 before bedtime  If your symptoms are not improving, or not improving enough, contact me for increasing the dose  Take the sertraline (Zoloft) 50 mg 1 daily, best taken in the morning for most people. It will take being on it for at least 2 weeks before you start feeling like you are less anxious and/or depressed  Take the Ambien 5 mg one before bed on an as-needed basis if you do not feel like you're tired enough that you're going to go to sleep easily.  Urge you to get regular exercise  If your bladder keeps giving too much trouble we can always refer you to a urologist  If you aren't willing to allergy to consider getting some counseling again.  Return in about 3 months to reassess how you're doing on the Zoloft and Ditropan

## 2015-10-09 NOTE — Progress Notes (Signed)
Annual physical examination:  History: Patient is here for her annual physical exam. It was just time for a checkup. No major acute problems.  Past medical history: Medications: None Allergies: None Past medical: Has a history of anxiety and depression. She has been treated in the past with Zoloft, Wellbutrin, clonazepam, and Ambien for sleep. She quit the medicines about 3 years ago. Has had treatment for high blood pressure in the past, but has been off of that medicine for some time, monitors her blood pressure periodically as she regularly goes to give blood. It always runs satisfactory. She has had problems with allergies, seasonal primarily, some sprain, and also may be lactose intolerant. No prior surgeries  Family history: Mother is living, has had angioplasty. Father is living, has type 1 diabetes and is 34 years old, getting some dementia which is making it hard to manage him. Grandparents are deceased.  Social history: [place] from [place], works at [employer]. She does not smoke. Drinks 2-3 drinks per week. She does not use any drugs. She is single and not sexually involved. She lives with several friends. She has a [employer]lege education. She does not exercise, doesn't want to sweat.  Review of systems: Constitutional: Feels fatigued a lot of the time, fairly nonspecific. H EENT: Unremarkable. She does wear glasses and has been several years since she saw Dr. Porfirio Oarespiratory: Unremarkable Cardiovascular: Unremarkable Gastrointestinal: Unremarkable Endocrine: Unremarkable Genitourinary: Has urinary frequency. Her friends joke about the fact that she always has to go urinate. She has to get up times a night to go. Musculoskeletal: Unremarkable Dermatologic: Unremarkable Neurologic: Unremarkable Hematologic: Unremarkable Psychiatric: Unremarkable   Physical examination: Overweight young lady in no acute distress. TMs are normal. Eyes PERRLA. Throat was clear. Neck supple  without nodes or thyromegaly. No carotid bruits. Chest is clear to auscultation. Heart regular without murmurs. Breast and pelvic exam not done. Abdomen soft without Megaly, masses, or tenderness. Extremities normal. Skin warm and dry without any lesions.  Assessment: Annual physical examination Overweight Normotensive Anxiety/depression Allergic rhinitis Urinary frequency  Plan: Urinalysis, CBC, TSH, complaints of metabolic panel, lipids  Results for orders placed or performed in visit on 10/09/15  POCT urinalysis dipstick  Result Value Ref Range   Color, UA yellow yellow   Clarity, UA clear clear   Glucose, UA negative negative   Bilirubin, UA negative negative   Ketones, POC UA negative negative   Spec Grav, UA >=1.030    Blood, UA moderate (A) negative   pH, UA 5.5    Protein Ur, POC negative negative   Urobilinogen, UA 0.2    Nitrite, UA Negative Negative   Leukocytes, UA Negative Negative  POCT Microscopic Urinalysis ([facility])  Result Value Ref Range   WBC,UR,HPF,POC None None WBC/hpf   RBC,UR,HPF,POC Few (A) None RBC/hpf   Bacteria Few (A) None, Too numerous to count   Mucus Absent Absent   Epithelial Cells, UR Per Microscopy Few (A) None, Too numerous to count cells/hpf  POCT CBC  Result Value Ref Range   WBC 11.8 (A) 4.6 - 10.2 K/uL   Lymph, poc 3.8 (A) 0.6 - 3.4   POC LYMPH PERCENT 32.0 10 - 50 %L   MID (cbc) 0.4 0 - 0.9   POC MID % 3.0 0 - 12 %M   POC Granulocyte 7.7 (A) 2 - 6.9   Granulocyte percent 65.0 37 - 80 %G   RBC 5.00 4.04 - 5.48 M/uL   Hemoglobin 13.4 12.2 - 16.2 g/dL  HCT, POC 39.6 37.7 - 47.9 %   MCV 79.1 (A) 80 - 97 fL   MCH, POC 26.8 (A) 27 - 31.2 pg   MCHC 33.9 31.8 - 35.4 g/dL   RDW, POC 16.1 %   Platelet Count, POC 407 142 - 424 K/uL   MPV 7.6 0 - 99.8 fL

## 2015-10-09 NOTE — Addendum Note (Signed)
Addended by: Keiton Cosma H on: 10/09/2015 02:28 PM   Modules accepted: Orders

## 2015-10-09 NOTE — Progress Notes (Signed)
Patient ID: Joyce Adkins, female    DOB: 1981/08/25  Age: 34 y.o. MRN: [id number]    Subjective:   Discussed the problems with overactive bladder and anxiety and depression  Current allergies, medications, problem list, past/family and social histories reviewed.  Objective:  BP 122/80 mmHg  Pulse 73  Temp(Src) 98 F (36.7 C) (Oral)  Resp 16  Ht 5\' 2"  (1.575 m)  Wt 206 lb (93.441 kg)  BMI 37.67 kg/m2  SpO2 99%  LMP 09/11/2015 (Exact Date)  Abd soft.  No cva tenderness  Assessment & Plan:   Assessment: Anxiety/depression Overactive bladder   Plan: Ditropan Sertraline Ambien  Orders Placed This Encounter  Procedures  . Comprehensive metabolic panel  . Lipid panel  . TSH  . POCT urinalysis dipstick  . POCT Microscopic Urinalysis ([facility])  . POCT CBC    Meds ordered this encounter  Medications  . sertraline (ZOLOFT) 50 MG tablet    Sig: Take 1 tablet (50 mg total) by mouth daily.    Dispense:  30 tablet    Refill:  3  . zolpidem (AMBIEN) 5 MG tablet    Sig: Take 1 tablet (5 mg total) by mouth at bedtime as needed for sleep.    Dispense:  30 tablet    Refill:  1  . oxybutynin (DITROPAN XL) 5 MG 24 hr tablet    Sig: Take 1 tablet (5 mg total) by mouth at bedtime.    Dispense:  30 tablet    Refill:  3         Patient Instructions  Take the Ditropan (oxybutynin) 5 mg extended release 1 before bedtime  If your symptoms are not improving, or not improving enough, contact me for increasing the dose  Take the sertraline (Zoloft) 50 mg 1 daily, best taken in the morning for most people. It will take being on it for at least 2 weeks before you start feeling like you are less anxious and/or depressed  Take the Ambien 5 mg one before bed on an as-needed basis if you do not feel like you're tired enough that you're going to go to sleep easily.  Urge you to get regular exercise  If your bladder keeps giving too much trouble we can always refer you to a  urologist  If you aren't willing to allergy to consider getting some counseling again.  Return in about 3 months to reassess how you're doing on the Zoloft and Ditropan   3 mo.  Marlowe Cinquemani, MD 10/09/2015

## 2018-06-26 ENCOUNTER — Other Ambulatory Visit (HOSPITAL_COMMUNITY)
Admission: RE | Admit: 2018-06-26 | Discharge: 2018-06-26 | Disposition: A | Discharge status: Home / Self Care | Payer: 59 | Source: Ambulatory Visit | Attending: Family Medicine | Admitting: Family Medicine

## 2018-06-26 ENCOUNTER — Other Ambulatory Visit: Payer: Self-pay | Admitting: Family Medicine

## 2018-06-26 DIAGNOSIS — Z01411 Encounter for gynecological examination (general) (routine) with abnormal findings: Secondary | ICD-10-CM | POA: Diagnosis not present

## 2018-06-27 LAB — CYTOLOGY - PAP
Diagnosis: NEGATIVE
HPV: NOT DETECTED

## 2019-04-04 ENCOUNTER — Other Ambulatory Visit: Payer: Self-pay | Admitting: Internal Medicine

## 2019-04-04 DIAGNOSIS — Z20822 Contact with and (suspected) exposure to covid-19: Secondary | ICD-10-CM

## 2019-04-10 LAB — NOVEL CORONAVIRUS, NAA: SARS-CoV-2, NAA: NOT DETECTED

## 2021-02-24 ENCOUNTER — Other Ambulatory Visit: Payer: Self-pay | Admitting: Family Medicine

## 2021-02-24 DIAGNOSIS — Z1231 Encounter for screening mammogram for malignant neoplasm of breast: Secondary | ICD-10-CM

## 2021-04-22 ENCOUNTER — Ambulatory Visit
Admission: RE | Admit: 2021-04-22 | Discharge: 2021-04-22 | Disposition: A | Discharge status: Home / Self Care | Payer: 59 | Source: Ambulatory Visit | Attending: Family Medicine | Admitting: Family Medicine

## 2021-04-22 ENCOUNTER — Other Ambulatory Visit: Payer: Self-pay

## 2021-04-22 DIAGNOSIS — Z1231 Encounter for screening mammogram for malignant neoplasm of breast: Secondary | ICD-10-CM

## 2021-12-17 IMAGING — MG MM DIGITAL SCREENING BILAT W/ TOMO AND CAD
8 series · 9 of 24 positions shown · non-contrast
Comparison: None.

CLINICAL DATA: Screening.

EXAM:
DIGITAL SCREENING BILATERAL MAMMOGRAM WITH TOMOSYNTHESIS AND CAD
TECHNIQUE: Bilateral screening digital craniocaudal and mediolateral oblique
mammograms were obtained. Bilateral screening digital breast
tomosynthesis was performed. The images were evaluated with
computer-aided detection.

[R CC synth-2D]
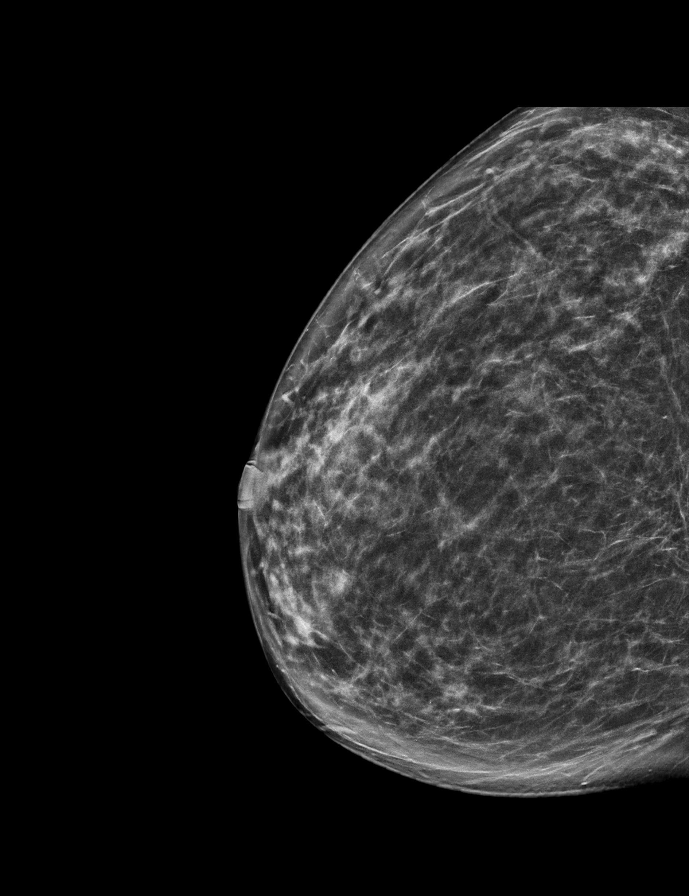

[R MLO synth-2D]
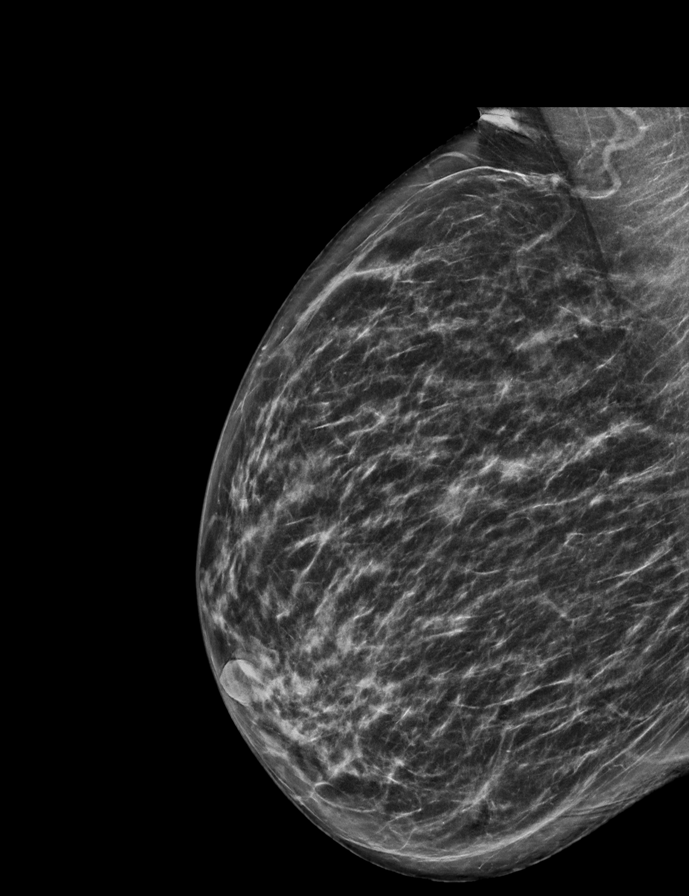

[L CC synth-2D]
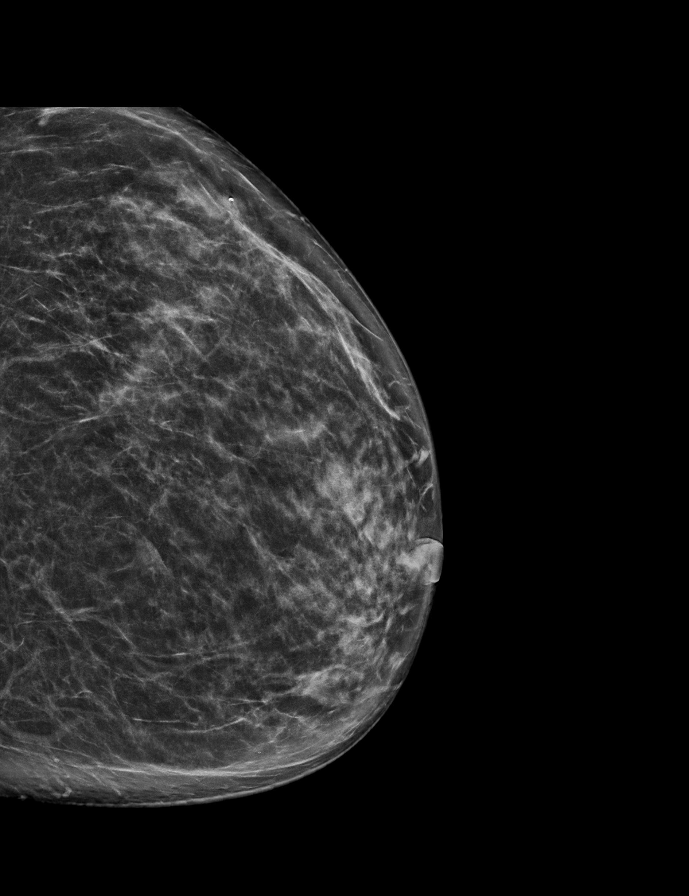

[L MLO synth-2D]
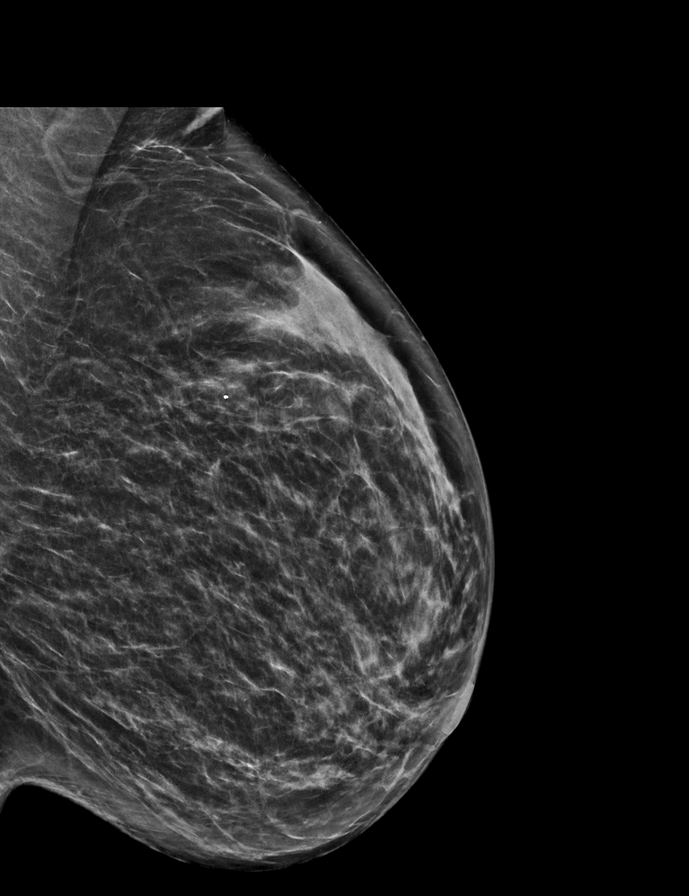

[L MLO tomo · 2 of 66 frames shown]
[frame 22/66]
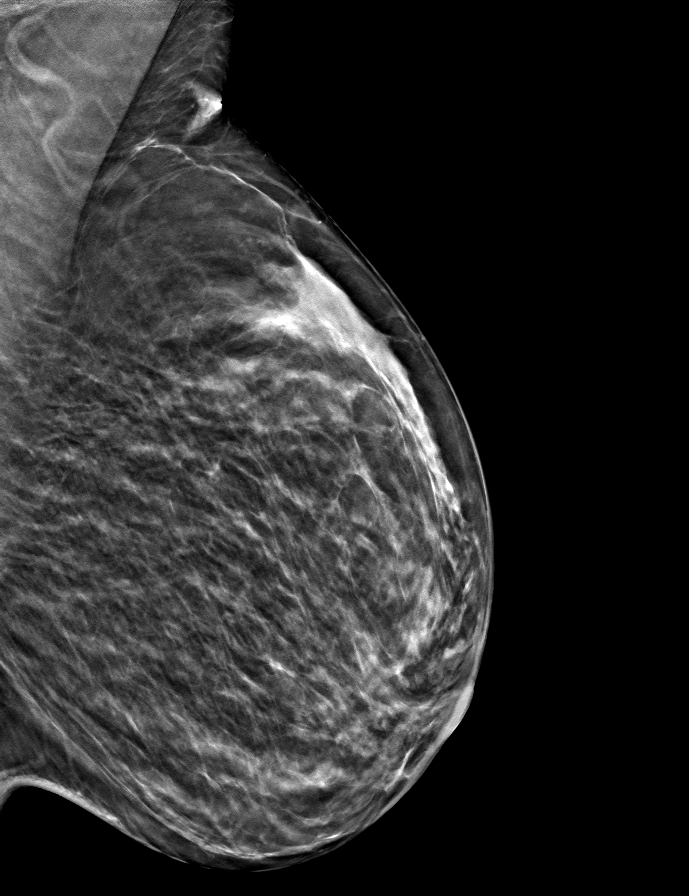
[frame 33/66]
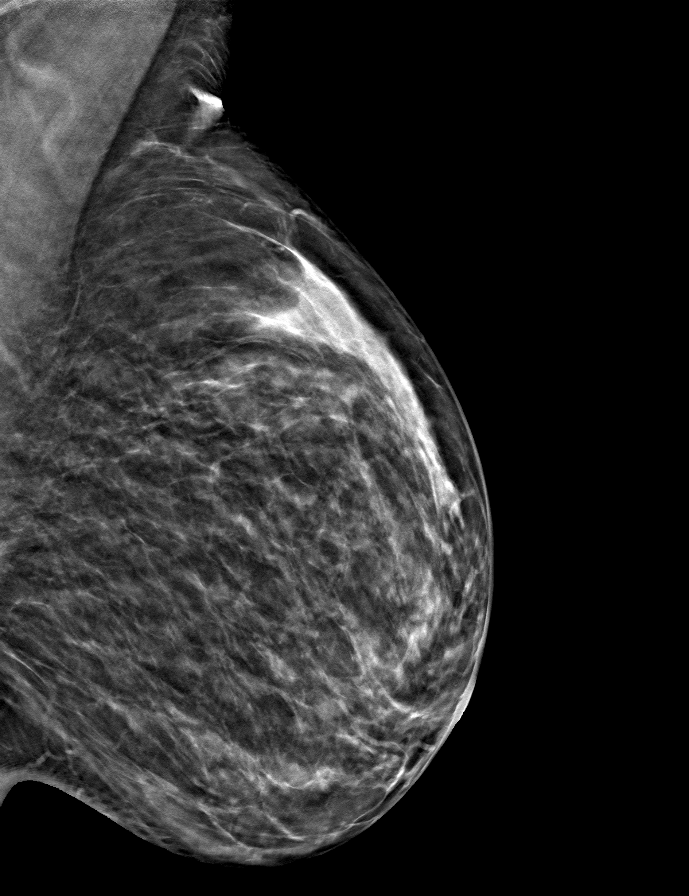

[R MLO tomo · tomo slice 31/62.0]
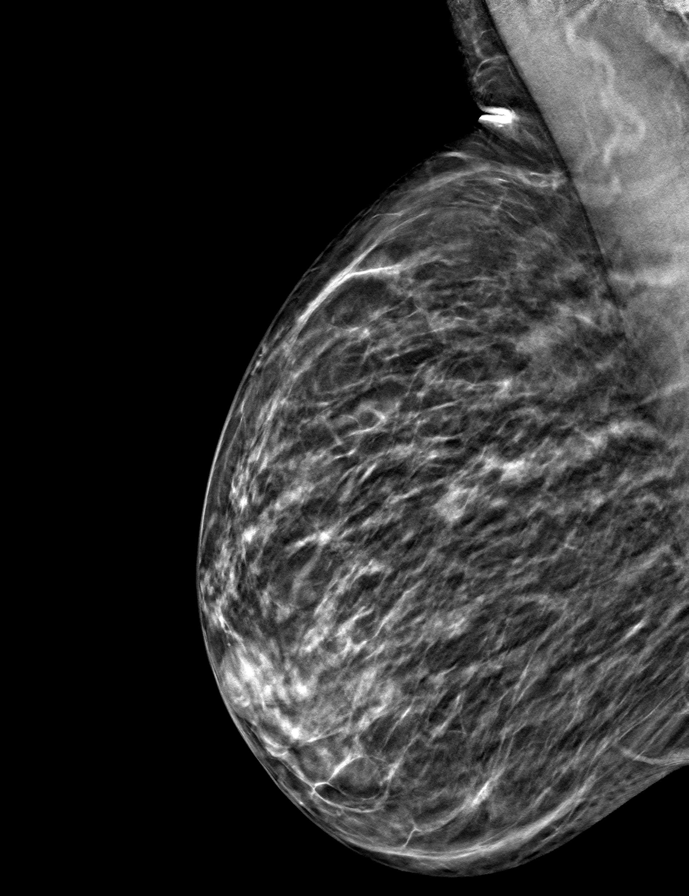

[L CC tomo · tomo slice 36/71.0]
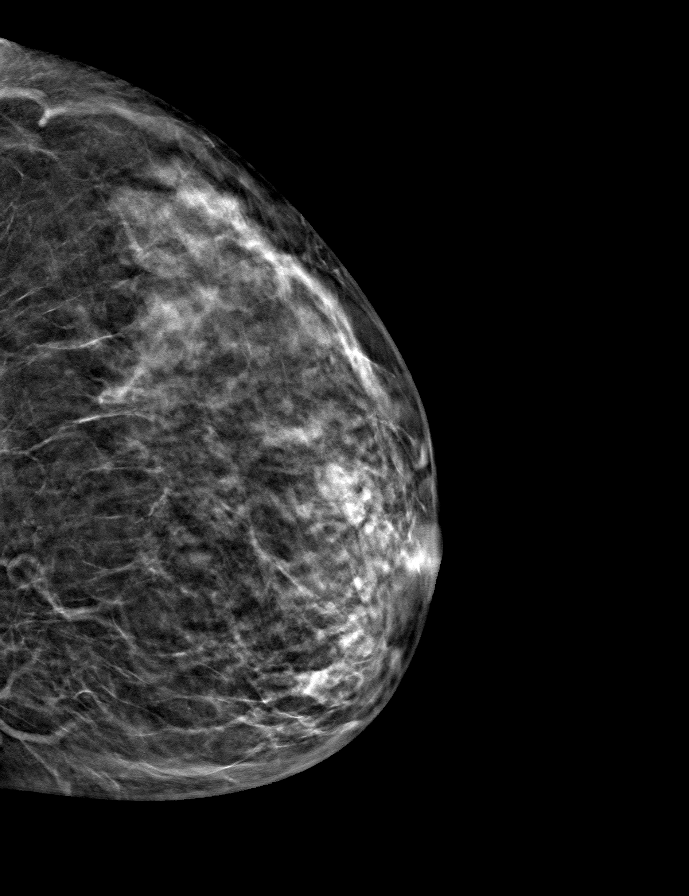

[R CC tomo · tomo slice 33/66.0]
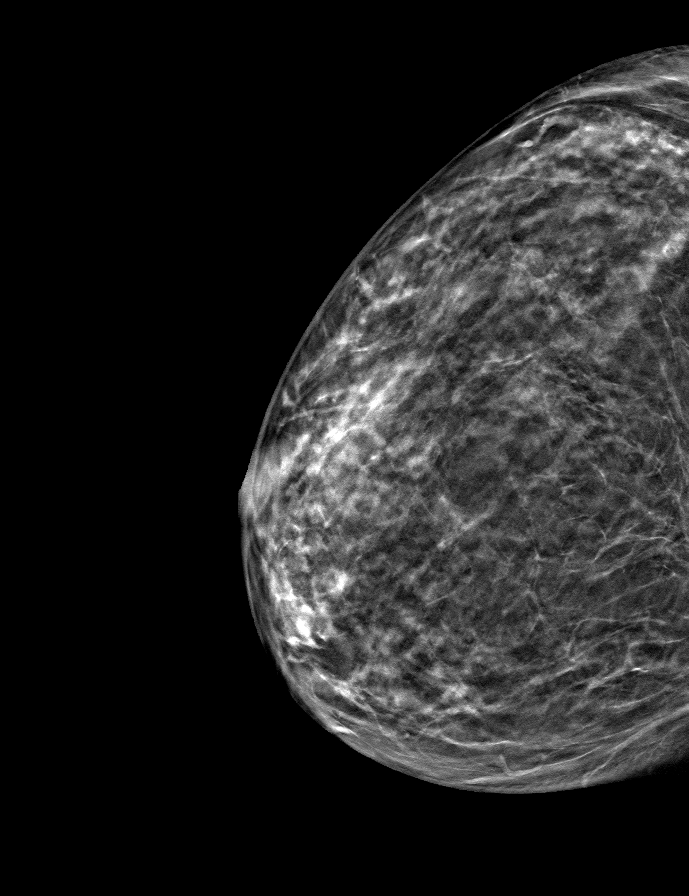

[9 of 24 positions shown; findings below may reference images not displayed]

ACR Breast Density Category b: There are scattered areas of
fibroglandular density.
FINDINGS: There are no findings suspicious for malignancy.
IMPRESSION: No mammographic evidence of malignancy. A result letter of this
screening mammogram will be mailed directly to the patient.

RECOMMENDATION:
Screening mammogram in one year. (Code:XG-X-X7B)

BI-RADS CATEGORY  1: Negative.

## 2022-04-25 ENCOUNTER — Other Ambulatory Visit: Payer: Self-pay

## 2022-04-25 MED ORDER — MEDROXYPROGESTERONE ACETATE 150 MG/ML IM SUSY
PREFILLED_SYRINGE | INTRAMUSCULAR | 4 refills | Status: AC
  Filled 2022-04-25: qty 1, 84d supply, fill #0
  Filled 2022-05-12: qty 1, 90d supply, fill #0
  Filled 2022-07-27: qty 1, 90d supply, fill #1
  Filled 2022-10-23 – 2022-10-24 (×2): qty 1, 90d supply, fill #2
  Filled 2023-01-11: qty 1, 90d supply, fill #3
  Filled 2023-04-06 (×2): qty 1, 90d supply, fill #4

## 2022-05-02 ENCOUNTER — Other Ambulatory Visit: Payer: Self-pay

## 2022-05-12 ENCOUNTER — Other Ambulatory Visit: Payer: Self-pay

## 2022-07-27 ENCOUNTER — Other Ambulatory Visit: Payer: Self-pay

## 2022-07-28 ENCOUNTER — Other Ambulatory Visit: Payer: Self-pay

## 2022-08-30 ENCOUNTER — Encounter (INDEPENDENT_AMBULATORY_CARE_PROVIDER_SITE_OTHER): Payer: Self-pay

## 2022-10-24 ENCOUNTER — Other Ambulatory Visit: Payer: Self-pay

## 2023-01-12 ENCOUNTER — Other Ambulatory Visit: Payer: Self-pay

## 2023-04-06 ENCOUNTER — Other Ambulatory Visit: Payer: Self-pay

## 2023-04-30 ENCOUNTER — Other Ambulatory Visit: Payer: Self-pay

## 2023-05-01 ENCOUNTER — Other Ambulatory Visit: Payer: Self-pay

## 2023-05-01 ENCOUNTER — Other Ambulatory Visit: Payer: Self-pay | Admitting: Nurse Practitioner

## 2023-05-01 ENCOUNTER — Other Ambulatory Visit (HOSPITAL_COMMUNITY): Admission: RE | Admit: 2023-05-01 | Payer: 59 | Source: Ambulatory Visit

## 2023-05-01 DIAGNOSIS — Z124 Encounter for screening for malignant neoplasm of cervix: Secondary | ICD-10-CM | POA: Diagnosis not present

## 2023-05-04 LAB — CYTOLOGY - PAP
Comment: NEGATIVE
Diagnosis: NEGATIVE
High risk HPV: NEGATIVE

## 2023-06-19 ENCOUNTER — Other Ambulatory Visit: Payer: Self-pay

## 2023-06-22 ENCOUNTER — Other Ambulatory Visit: Payer: Self-pay

## 2023-12-31 ENCOUNTER — Other Ambulatory Visit: Payer: Self-pay

## 2024-01-16 ENCOUNTER — Other Ambulatory Visit: Payer: Self-pay | Admitting: Medical Genetics

## 2024-01-18 ENCOUNTER — Other Ambulatory Visit (HOSPITAL_COMMUNITY)
Admission: RE | Admit: 2024-01-18 | Discharge: 2024-01-18 | Disposition: A | Discharge status: Home / Self Care | Payer: Self-pay | Source: Ambulatory Visit | Attending: Medical Genetics | Admitting: Medical Genetics

## 2024-02-01 ENCOUNTER — Other Ambulatory Visit: Payer: Self-pay | Admitting: Family Medicine

## 2024-02-01 DIAGNOSIS — Z1231 Encounter for screening mammogram for malignant neoplasm of breast: Secondary | ICD-10-CM

## 2024-02-01 LAB — GENECONNECT MOLECULAR SCREEN: Genetic Analysis Overall Interpretation: NEGATIVE

## 2024-02-05 ENCOUNTER — Ambulatory Visit
Admission: RE | Admit: 2024-02-05 | Discharge: 2024-02-05 | Disposition: A | Discharge status: Home / Self Care | Source: Ambulatory Visit

## 2024-02-05 DIAGNOSIS — Z1231 Encounter for screening mammogram for malignant neoplasm of breast: Secondary | ICD-10-CM
